# Patient Record
Sex: Female | Born: 1950 | Race: Black or African American | Hispanic: No | Marital: Married | Smoking: Former smoker
Health system: Southern US, Community
[De-identification: ages and names within clinical notes are randomized; demographics above are authoritative.]

## PROBLEM LIST (undated history)

## (undated) DIAGNOSIS — I10 Essential (primary) hypertension: Secondary | ICD-10-CM

## (undated) DIAGNOSIS — I639 Cerebral infarction, unspecified: Secondary | ICD-10-CM

## (undated) DIAGNOSIS — K219 Gastro-esophageal reflux disease without esophagitis: Secondary | ICD-10-CM

## (undated) DIAGNOSIS — J45909 Unspecified asthma, uncomplicated: Secondary | ICD-10-CM

## (undated) DIAGNOSIS — T4145XA Adverse effect of unspecified anesthetic, initial encounter: Secondary | ICD-10-CM

## (undated) DIAGNOSIS — D649 Anemia, unspecified: Secondary | ICD-10-CM

## (undated) DIAGNOSIS — E119 Type 2 diabetes mellitus without complications: Secondary | ICD-10-CM

## (undated) DIAGNOSIS — M1990 Unspecified osteoarthritis, unspecified site: Secondary | ICD-10-CM

## (undated) DIAGNOSIS — F419 Anxiety disorder, unspecified: Secondary | ICD-10-CM

## (undated) DIAGNOSIS — T8859XA Other complications of anesthesia, initial encounter: Secondary | ICD-10-CM

## (undated) DIAGNOSIS — G4730 Sleep apnea, unspecified: Secondary | ICD-10-CM

## (undated) HISTORY — PX: CARPAL TUNNEL RELEASE: SHX101

## (undated) HISTORY — PX: BACK SURGERY: SHX140

## (undated) HISTORY — PX: ABDOMINAL HYSTERECTOMY: SHX81

## (undated) HISTORY — PX: CERVICAL FUSION: SHX112

---

## 2004-06-11 ENCOUNTER — Inpatient Hospital Stay (HOSPITAL_COMMUNITY): Admission: RE | Admit: 2004-06-11 | Discharge: 2004-06-13 | Payer: Self-pay | Admitting: Neurosurgery

## 2005-06-11 IMAGING — RF DG CERVICAL SPINE 2 OR 3 VIEWS
1 series · 4 of 4 positions shown · non-contrast
Comparison: none

CLINICAL DATA: C3 through C7 anterior fusion for cervical disc herniation. 
TWO VIEW CERVICAL SPINE ? 06/11/04
AP and lateral C-arm views of the cervical spine demonstrate interbody bone plug and anterior screw and plate fusion at the C3 through C7 levels.  The bony detail is poorly visualized on the lateral view, making it impossible to confirm satisfactory screw position and impossible to assess alignment. 
IMPRESSION 
Suboptimal visualization of the cervical spine with hardware fusion as described above. Routine views are recommended when possible.

[Series 1: run · 4 of 4 slices shown]
[im 1/4]
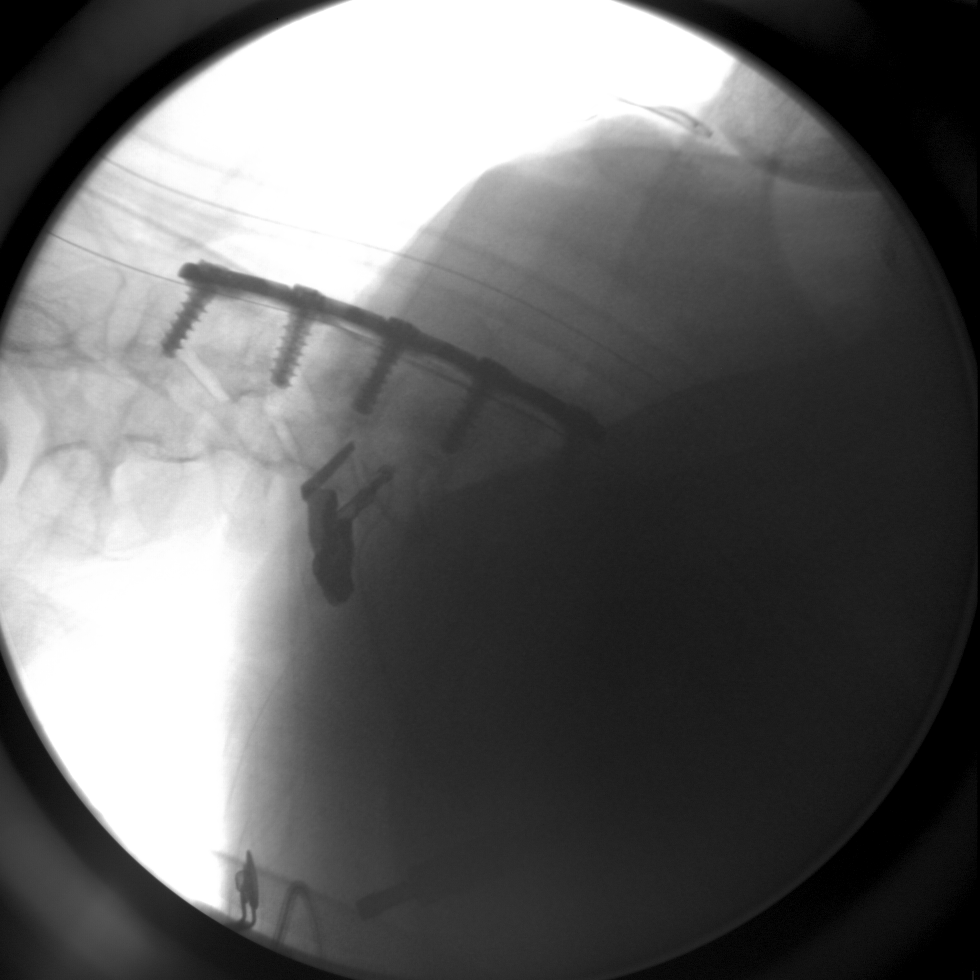
[im 2/4]
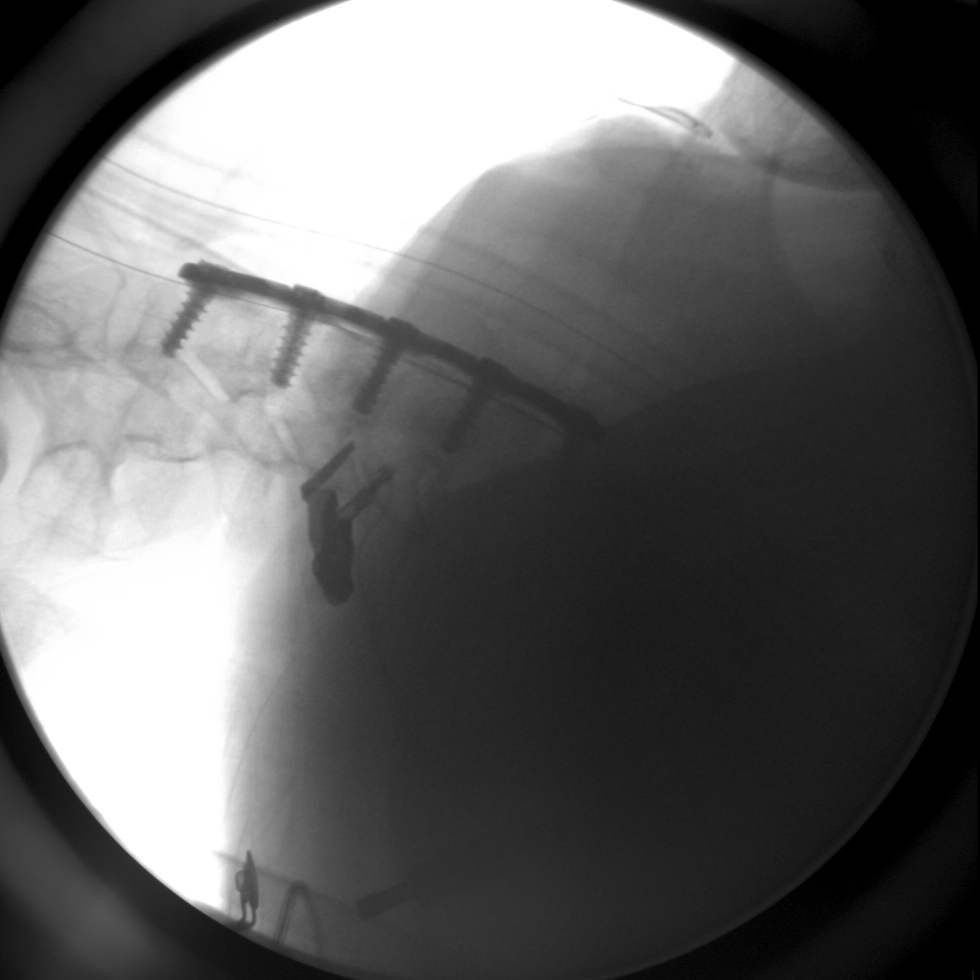
[im 3/4]
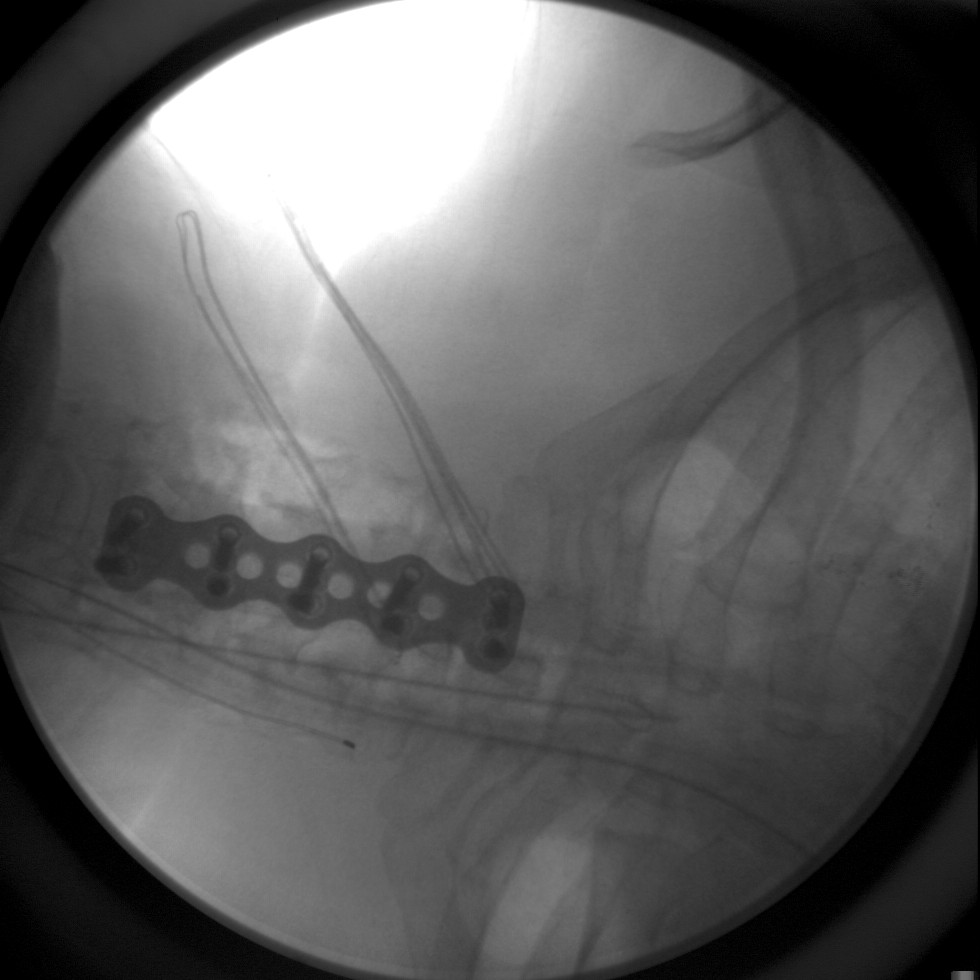
[im 4/4]
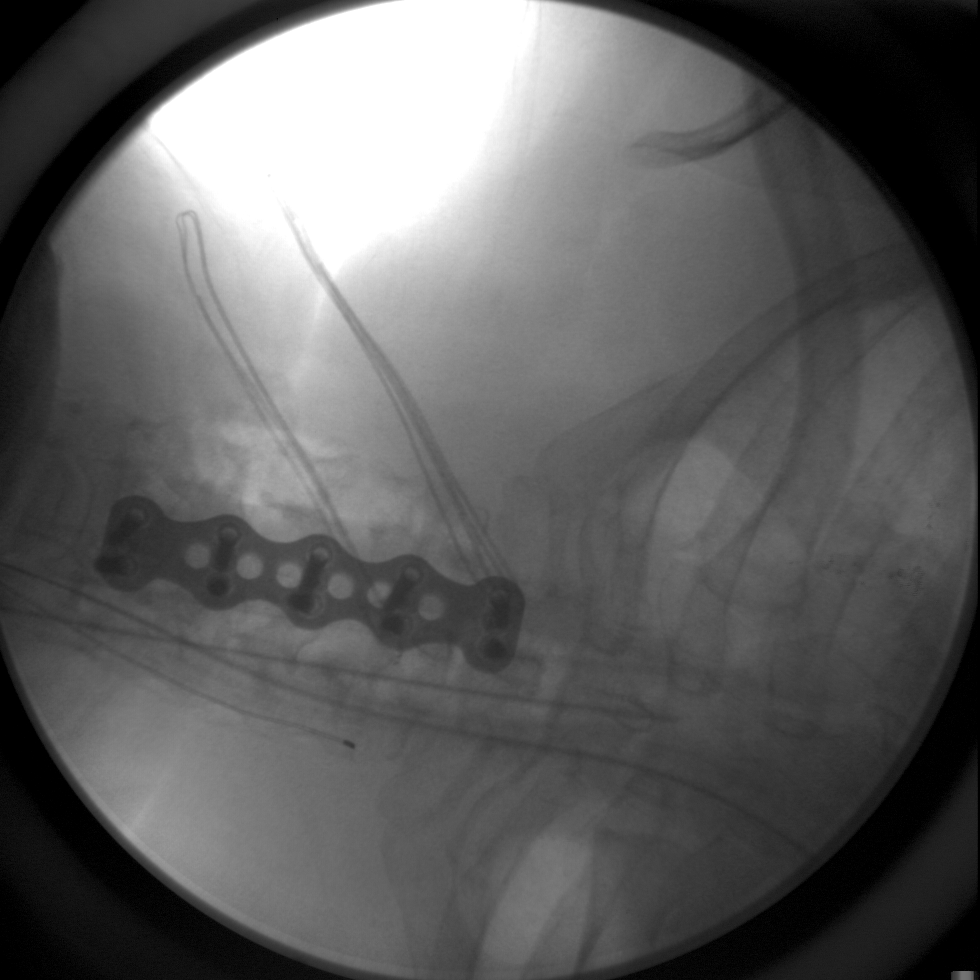

[4 of 4 positions shown; findings below may reference images not displayed]

## 2013-12-14 ENCOUNTER — Ambulatory Visit: Payer: Self-pay | Admitting: Neurosurgery

## 2015-03-23 ENCOUNTER — Ambulatory Visit
Admit: 2015-03-23 | Disposition: A | Discharge status: Home / Self Care | Payer: Self-pay | Attending: Neurosurgery | Admitting: Neurosurgery

## 2015-03-30 ENCOUNTER — Other Ambulatory Visit: Payer: Self-pay | Admitting: Neurosurgery

## 2015-04-04 ENCOUNTER — Encounter (HOSPITAL_COMMUNITY)
Admission: RE | Admit: 2015-04-04 | Discharge: 2015-04-04 | Disposition: A | Discharge status: Home / Self Care | Payer: Medicare Other | Source: Ambulatory Visit | Attending: Neurosurgery | Admitting: Neurosurgery

## 2015-04-04 ENCOUNTER — Encounter (HOSPITAL_COMMUNITY): Payer: Self-pay

## 2015-04-04 DIAGNOSIS — Z87891 Personal history of nicotine dependence: Secondary | ICD-10-CM | POA: Diagnosis not present

## 2015-04-04 DIAGNOSIS — Z981 Arthrodesis status: Secondary | ICD-10-CM | POA: Diagnosis not present

## 2015-04-04 DIAGNOSIS — M79604 Pain in right leg: Secondary | ICD-10-CM | POA: Diagnosis present

## 2015-04-04 DIAGNOSIS — Z8673 Personal history of transient ischemic attack (TIA), and cerebral infarction without residual deficits: Secondary | ICD-10-CM | POA: Diagnosis not present

## 2015-04-04 DIAGNOSIS — G4730 Sleep apnea, unspecified: Secondary | ICD-10-CM | POA: Diagnosis not present

## 2015-04-04 DIAGNOSIS — E119 Type 2 diabetes mellitus without complications: Secondary | ICD-10-CM | POA: Diagnosis not present

## 2015-04-04 DIAGNOSIS — Z90710 Acquired absence of both cervix and uterus: Secondary | ICD-10-CM | POA: Diagnosis not present

## 2015-04-04 DIAGNOSIS — K219 Gastro-esophageal reflux disease without esophagitis: Secondary | ICD-10-CM | POA: Diagnosis not present

## 2015-04-04 DIAGNOSIS — J45909 Unspecified asthma, uncomplicated: Secondary | ICD-10-CM | POA: Diagnosis not present

## 2015-04-04 DIAGNOSIS — I10 Essential (primary) hypertension: Secondary | ICD-10-CM | POA: Diagnosis not present

## 2015-04-04 DIAGNOSIS — M5117 Intervertebral disc disorders with radiculopathy, lumbosacral region: Secondary | ICD-10-CM | POA: Diagnosis not present

## 2015-04-04 DIAGNOSIS — F419 Anxiety disorder, unspecified: Secondary | ICD-10-CM | POA: Diagnosis not present

## 2015-04-04 DIAGNOSIS — Z7951 Long term (current) use of inhaled steroids: Secondary | ICD-10-CM | POA: Diagnosis not present

## 2015-04-04 HISTORY — DX: Essential (primary) hypertension: I10

## 2015-04-04 HISTORY — DX: Unspecified asthma, uncomplicated: J45.909

## 2015-04-04 HISTORY — DX: Anemia, unspecified: D64.9

## 2015-04-04 HISTORY — DX: Adverse effect of unspecified anesthetic, initial encounter: T41.45XA

## 2015-04-04 HISTORY — DX: Other complications of anesthesia, initial encounter: T88.59XA

## 2015-04-04 HISTORY — DX: Type 2 diabetes mellitus without complications: E11.9

## 2015-04-04 HISTORY — DX: Unspecified osteoarthritis, unspecified site: M19.90

## 2015-04-04 HISTORY — DX: Cerebral infarction, unspecified: I63.9

## 2015-04-04 HISTORY — DX: Sleep apnea, unspecified: G47.30

## 2015-04-04 HISTORY — DX: Anxiety disorder, unspecified: F41.9

## 2015-04-04 HISTORY — DX: Gastro-esophageal reflux disease without esophagitis: K21.9

## 2015-04-04 LAB — BASIC METABOLIC PANEL
ANION GAP: 11 (ref 5–15)
BUN: 12 mg/dL (ref 6–23)
CALCIUM: 9.9 mg/dL (ref 8.4–10.5)
CO2: 28 mmol/L (ref 19–32)
CREATININE: 0.88 mg/dL (ref 0.50–1.10)
Chloride: 100 mmol/L (ref 96–112)
GFR calc Af Amer: 78 mL/min — ABNORMAL LOW (ref >90)
GFR, EST NON AFRICAN AMERICAN: 67 mL/min — AB (ref >90)
Glucose, Bld: 71 mg/dL (ref 70–99)
Potassium: 3.6 mmol/L (ref 3.5–5.1)
Sodium: 139 mmol/L (ref 135–145)

## 2015-04-04 LAB — CBC WITH DIFFERENTIAL/PLATELET
BASOS ABS: 0 10*3/uL (ref 0.0–0.1)
BASOS PCT: 1 % (ref 0–1)
Eosinophils Absolute: 0.2 10*3/uL (ref 0.0–0.7)
Eosinophils Relative: 4 % (ref 0–5)
HCT: 34.8 % — ABNORMAL LOW (ref 36.0–46.0)
HEMOGLOBIN: 11.2 g/dL — AB (ref 12.0–15.0)
LYMPHS PCT: 43 % (ref 12–46)
Lymphs Abs: 2 10*3/uL (ref 0.7–4.0)
MCH: 27.9 pg (ref 26.0–34.0)
MCHC: 32.2 g/dL (ref 30.0–36.0)
MCV: 86.6 fL (ref 78.0–100.0)
MONOS PCT: 9 % (ref 3–12)
Monocytes Absolute: 0.4 10*3/uL (ref 0.1–1.0)
NEUTROS ABS: 2 10*3/uL (ref 1.7–7.7)
NEUTROS PCT: 43 % (ref 43–77)
Platelets: 242 10*3/uL (ref 150–400)
RBC: 4.02 MIL/uL (ref 3.87–5.11)
RDW: 14.1 % (ref 11.5–15.5)
WBC: 4.7 10*3/uL (ref 4.0–10.5)

## 2015-04-04 LAB — SURGICAL PCR SCREEN
MRSA, PCR: NEGATIVE
Staphylococcus aureus: NEGATIVE

## 2015-04-04 MED ORDER — DEXAMETHASONE SODIUM PHOSPHATE 10 MG/ML IJ SOLN
10.0000 mg | INTRAMUSCULAR | Status: AC
  Administered 2015-04-05: 10 mg via INTRAVENOUS
  Filled 2015-04-04: qty 1

## 2015-04-04 MED ORDER — CEFAZOLIN SODIUM-DEXTROSE 2-3 GM-% IV SOLR
2.0000 g | INTRAVENOUS | Status: AC
  Administered 2015-04-05: 2 g via INTRAVENOUS

## 2015-04-04 NOTE — Pre-Procedure Instructions (Signed)
Jessica Perkins  04/04/2015   Your procedure is scheduled on:  04-05-2015   Thursday   Report [facility] at 6:00 AM.   Call this number if you have problems the morning of surgery: [phone]   Remember:   Do not eat food or drink liquids after midnight.    Take these medicines the morning of surgery with A SIP OF WATER: inhaler if needed,astelin nasal spray,cyclobenzaprine(Flexeril)doxazocen(Cardura),fexofendine(allegra),Metoprolol(Lopressor),omeprazole(Prilosec)      Do not wear jewelry, make-up or nail polish.  Do not wear lotions, powders, or perfumes.   Do not shave 48 hours prior to surgery.   Do not bring valuables to the hospital.  [facility] is not responsible  for any belongings or valuables.               Contacts, dentures or bridgework may not be worn into surgery.   Leave suitcase in the car. After surgery it may be brought to your room.   For patients admitted to the hospital, discharge time is determined by your    treatment team.               Patients discharged the day of surgery will not be allowed to drive home    Special Instructions: See attached sheet for instructions on CHG shower/bath     Please read over the following fact sheets that you were given: Pain Booklet and Surgical Site Infection Prevention

## 2015-04-04 NOTE — Progress Notes (Signed)
Anesthesia Chart Review:  Patient is a 64 year old female scheduled for right L5-S1 microdiskectomy tomorrow by Dr. Jordan LikesPool.  History includes former smoker since 12/15/14, HTN, DM2, OSA with CPAP use, CVA, asthma, anemia, GERD, arthritis, cervical fusion, hysterectomy, back surgery. PCP is listed as Dr. Lanora ManisElizabeth Aderoju ([facility], see Care Everywhere).   12/21/11 Nuclear stress test (Care Everywhere; done during admission for atypical chest pain): IMPRESSION: 1. Estimated left ventricular ejection fraction within normal limits, 67 %. 2. Findings compatible with artifact or sequela from prior infarct. No evidence of reversible ischemia.  07/27/06 Echo (Care Everywhere): 1. This is a good quality study. 2. The size and ejection fraction of the left ventricle are normal. 3. The left atrium and right heart are normal. 4. The mitral valve appears normal. There is very slight mitral regurgitation. 5. The aortic valve is normal. 6. There is no pericardial effusion.  04/04/15 EKG: NSR, minimal voltage criteria for LVH, may be normal variant, septal infarct (age undetermined), non-specific ST flattening (rather diffuse).  There are EKG interpretations in Care Everywhere from 12/2011 that mentions SR with non-specific inferolateral T wave abnormalities.  Inferolateral T wave abnormality has improved when compared to 06/10/04 EKG in [facility].    Preoperative labs noted.   Further evaluation by her assigned anesthesiologist on the day of surgery.  If she remains asymptomatic from a CV standpoint then I would anticipate that she could proceed as planned.  Velna Ochsllison Othello Dickenson, PA-C [facility]Anesthesiology Phone [phone] 04/04/2015 3:28 PM

## 2015-04-05 ENCOUNTER — Inpatient Hospital Stay (HOSPITAL_COMMUNITY): Payer: Medicare Other

## 2015-04-05 ENCOUNTER — Inpatient Hospital Stay (HOSPITAL_COMMUNITY): Payer: Medicare Other | Admitting: Anesthesiology

## 2015-04-05 ENCOUNTER — Encounter (HOSPITAL_COMMUNITY): Payer: Self-pay | Admitting: Critical Care Medicine

## 2015-04-05 ENCOUNTER — Observation Stay (HOSPITAL_COMMUNITY)
Admission: RE | Admit: 2015-04-05 | Discharge: 2015-04-06 | Disposition: A | Discharge status: Home / Self Care | Payer: Medicare Other | Source: Ambulatory Visit | Attending: Neurosurgery | Admitting: Neurosurgery

## 2015-04-05 ENCOUNTER — Inpatient Hospital Stay (HOSPITAL_COMMUNITY): Payer: Medicare Other | Admitting: Vascular Surgery

## 2015-04-05 ENCOUNTER — Encounter (HOSPITAL_COMMUNITY)
Admission: RE | Disposition: A | Discharge status: Home / Self Care | Payer: Self-pay | Source: Ambulatory Visit | Attending: Neurosurgery

## 2015-04-05 DIAGNOSIS — I10 Essential (primary) hypertension: Secondary | ICD-10-CM | POA: Insufficient documentation

## 2015-04-05 DIAGNOSIS — M5116 Intervertebral disc disorders with radiculopathy, lumbar region: Secondary | ICD-10-CM | POA: Diagnosis present

## 2015-04-05 DIAGNOSIS — Z7951 Long term (current) use of inhaled steroids: Secondary | ICD-10-CM | POA: Insufficient documentation

## 2015-04-05 DIAGNOSIS — G4730 Sleep apnea, unspecified: Secondary | ICD-10-CM | POA: Diagnosis not present

## 2015-04-05 DIAGNOSIS — Z8673 Personal history of transient ischemic attack (TIA), and cerebral infarction without residual deficits: Secondary | ICD-10-CM | POA: Insufficient documentation

## 2015-04-05 DIAGNOSIS — M5117 Intervertebral disc disorders with radiculopathy, lumbosacral region: Secondary | ICD-10-CM | POA: Diagnosis not present

## 2015-04-05 DIAGNOSIS — J45909 Unspecified asthma, uncomplicated: Secondary | ICD-10-CM | POA: Insufficient documentation

## 2015-04-05 DIAGNOSIS — Z90710 Acquired absence of both cervix and uterus: Secondary | ICD-10-CM | POA: Insufficient documentation

## 2015-04-05 DIAGNOSIS — K219 Gastro-esophageal reflux disease without esophagitis: Secondary | ICD-10-CM | POA: Insufficient documentation

## 2015-04-05 DIAGNOSIS — M5126 Other intervertebral disc displacement, lumbar region: Secondary | ICD-10-CM | POA: Diagnosis present

## 2015-04-05 DIAGNOSIS — Z981 Arthrodesis status: Secondary | ICD-10-CM | POA: Insufficient documentation

## 2015-04-05 DIAGNOSIS — F419 Anxiety disorder, unspecified: Secondary | ICD-10-CM | POA: Insufficient documentation

## 2015-04-05 DIAGNOSIS — E119 Type 2 diabetes mellitus without complications: Secondary | ICD-10-CM | POA: Insufficient documentation

## 2015-04-05 DIAGNOSIS — Z87891 Personal history of nicotine dependence: Secondary | ICD-10-CM | POA: Insufficient documentation

## 2015-04-05 HISTORY — PX: LUMBAR LAMINECTOMY/DECOMPRESSION MICRODISCECTOMY: SHX5026

## 2015-04-05 LAB — GLUCOSE, CAPILLARY
GLUCOSE-CAPILLARY: 138 mg/dL — AB (ref 70–99)
GLUCOSE-CAPILLARY: 166 mg/dL — AB (ref 70–99)
Glucose-Capillary: 91 mg/dL (ref 70–99)
Glucose-Capillary: 91 mg/dL (ref 70–99)
Glucose-Capillary: 145 mg/dL — ABNORMAL HIGH (ref 70–99)

## 2015-04-05 SURGERY — LUMBAR LAMINECTOMY/DECOMPRESSION MICRODISCECTOMY 1 LEVEL
Anesthesia: General | Site: Back | Laterality: Right

## 2015-04-05 MED ORDER — LIDOCAINE HCL (CARDIAC) 20 MG/ML IV SOLN
INTRAVENOUS | Status: AC
  Filled 2015-04-05: qty 5

## 2015-04-05 MED ORDER — SODIUM CHLORIDE 0.9 % IJ SOLN
INTRAMUSCULAR | Status: AC
  Filled 2015-04-05: qty 10

## 2015-04-05 MED ORDER — OXYCODONE HCL 5 MG/5ML PO SOLN: 5.0000 mg | Freq: Once | ORAL | Status: DC | PRN

## 2015-04-05 MED ORDER — AZELASTINE HCL 0.1 % NA SOLN
2.0000 | Freq: Two times a day (BID) | NASAL | Status: DC
  Administered 2015-04-05: 2 via NASAL
  Filled 2015-04-05: qty 30

## 2015-04-05 MED ORDER — HYDROCHLOROTHIAZIDE 25 MG PO TABS
25.0000 mg | ORAL_TABLET | Freq: Every day | ORAL | Status: DC
  Administered 2015-04-05 – 2015-04-06 (×2): 25 mg via ORAL
  Filled 2015-04-05 (×2): qty 1

## 2015-04-05 MED ORDER — LIDOCAINE HCL (CARDIAC) 20 MG/ML IV SOLN
INTRAVENOUS | Status: DC | PRN
  Administered 2015-04-05: 50 mg via INTRAVENOUS

## 2015-04-05 MED ORDER — FENTANYL CITRATE (PF) 250 MCG/5ML IJ SOLN
INTRAMUSCULAR | Status: AC
  Filled 2015-04-05: qty 5

## 2015-04-05 MED ORDER — ACETAMINOPHEN 325 MG PO TABS: 650.0000 mg | ORAL_TABLET | ORAL | Status: DC | PRN

## 2015-04-05 MED ORDER — HYDROMORPHONE HCL 1 MG/ML IJ SOLN
INTRAMUSCULAR | Status: AC
  Administered 2015-04-05: 0.5 mg via INTRAVENOUS
  Filled 2015-04-05: qty 1

## 2015-04-05 MED ORDER — KETOROLAC TROMETHAMINE 30 MG/ML IJ SOLN
30.0000 mg | Freq: Four times a day (QID) | INTRAMUSCULAR | Status: DC
  Administered 2015-04-05 (×2): 30 mg via INTRAVENOUS
  Filled 2015-04-05 (×6): qty 1

## 2015-04-05 MED ORDER — ONDANSETRON HCL 4 MG/2ML IJ SOLN: 4.0000 mg | INTRAMUSCULAR | Status: DC | PRN

## 2015-04-05 MED ORDER — BUPIVACAINE HCL (PF) 0.25 % IJ SOLN
INTRAMUSCULAR | Status: DC | PRN
  Administered 2015-04-05: 20 mL

## 2015-04-05 MED ORDER — PRAVASTATIN SODIUM 20 MG PO TABS
20.0000 mg | ORAL_TABLET | Freq: Every day | ORAL | Status: DC
  Administered 2015-04-05: 20 mg via ORAL
  Filled 2015-04-05 (×2): qty 1

## 2015-04-05 MED ORDER — LORATADINE 10 MG PO TABS
10.0000 mg | ORAL_TABLET | Freq: Every day | ORAL | Status: DC
  Administered 2015-04-05 – 2015-04-06 (×2): 10 mg via ORAL
  Filled 2015-04-05 (×2): qty 1

## 2015-04-05 MED ORDER — FENTANYL CITRATE (PF) 100 MCG/2ML IJ SOLN
INTRAMUSCULAR | Status: DC | PRN
  Administered 2015-04-05 (×3): 50 ug via INTRAVENOUS

## 2015-04-05 MED ORDER — ROCURONIUM BROMIDE 100 MG/10ML IV SOLN
INTRAVENOUS | Status: DC | PRN
  Administered 2015-04-05: 30 mg via INTRAVENOUS
  Administered 2015-04-05: 10 mg via INTRAVENOUS

## 2015-04-05 MED ORDER — PROPOFOL 10 MG/ML IV BOLUS
INTRAVENOUS | Status: DC | PRN
  Administered 2015-04-05: 140 mg via INTRAVENOUS

## 2015-04-05 MED ORDER — NEOSTIGMINE METHYLSULFATE 10 MG/10ML IV SOLN
INTRAVENOUS | Status: DC | PRN
  Administered 2015-04-05: 3 mg via INTRAVENOUS

## 2015-04-05 MED ORDER — CEFAZOLIN SODIUM 1-5 GM-% IV SOLN
1.0000 g | Freq: Three times a day (TID) | INTRAVENOUS | Status: AC
  Administered 2015-04-05 (×2): 1 g via INTRAVENOUS
  Filled 2015-04-05 (×2): qty 50

## 2015-04-05 MED ORDER — HYDROCODONE-ACETAMINOPHEN 5-325 MG PO TABS
1.0000 | ORAL_TABLET | ORAL | Status: DC | PRN
  Administered 2015-04-05 – 2015-04-06 (×4): 2 via ORAL
  Filled 2015-04-05 (×4): qty 2

## 2015-04-05 MED ORDER — PROPOFOL 10 MG/ML IV BOLUS
INTRAVENOUS | Status: AC
  Filled 2015-04-05: qty 20

## 2015-04-05 MED ORDER — OXYCODONE HCL 5 MG PO TABS: 5.0000 mg | ORAL_TABLET | Freq: Once | ORAL | Status: DC | PRN

## 2015-04-05 MED ORDER — CYCLOBENZAPRINE HCL 10 MG PO TABS
10.0000 mg | ORAL_TABLET | Freq: Three times a day (TID) | ORAL | Status: DC | PRN
  Administered 2015-04-05 (×2): 10 mg via ORAL
  Filled 2015-04-05 (×3): qty 1

## 2015-04-05 MED ORDER — ROCURONIUM BROMIDE 50 MG/5ML IV SOLN
INTRAVENOUS | Status: AC
  Filled 2015-04-05: qty 1

## 2015-04-05 MED ORDER — MOMETASONE FURO-FORMOTEROL FUM 100-5 MCG/ACT IN AERO
2.0000 | INHALATION_SPRAY | Freq: Two times a day (BID) | RESPIRATORY_TRACT | Status: DC
  Administered 2015-04-06: 2 via RESPIRATORY_TRACT
  Filled 2015-04-05: qty 8.8

## 2015-04-05 MED ORDER — PANTOPRAZOLE SODIUM 40 MG PO TBEC
40.0000 mg | DELAYED_RELEASE_TABLET | Freq: Every day | ORAL | Status: DC
  Administered 2015-04-06: 40 mg via ORAL
  Filled 2015-04-05: qty 1

## 2015-04-05 MED ORDER — SODIUM CHLORIDE 0.9 % IR SOLN
Status: DC | PRN
  Administered 2015-04-05: 08:00:00

## 2015-04-05 MED ORDER — THROMBIN 5000 UNITS EX SOLR
CUTANEOUS | Status: DC | PRN
Start: 2015-04-05 — End: 2015-04-05
  Administered 2015-04-05 (×2): 5000 [IU] via TOPICAL

## 2015-04-05 MED ORDER — MIDAZOLAM HCL 5 MG/5ML IJ SOLN
INTRAMUSCULAR | Status: DC | PRN
  Administered 2015-04-05: 2 mg via INTRAVENOUS

## 2015-04-05 MED ORDER — HYDROMORPHONE HCL 1 MG/ML IJ SOLN
0.2500 mg | INTRAMUSCULAR | Status: DC | PRN
  Administered 2015-04-05 (×4): 0.5 mg via INTRAVENOUS

## 2015-04-05 MED ORDER — IRBESARTAN 300 MG PO TABS
300.0000 mg | ORAL_TABLET | Freq: Every day | ORAL | Status: DC
  Administered 2015-04-05 – 2015-04-06 (×2): 300 mg via ORAL
  Filled 2015-04-05 (×2): qty 1

## 2015-04-05 MED ORDER — KETOROLAC TROMETHAMINE 30 MG/ML IJ SOLN: 30.0000 mg | Freq: Once | INTRAMUSCULAR | Status: DC | PRN

## 2015-04-05 MED ORDER — OXYCODONE-ACETAMINOPHEN 5-325 MG PO TABS
ORAL_TABLET | ORAL | Status: AC
  Administered 2015-04-05: 2 via ORAL
  Filled 2015-04-05: qty 2

## 2015-04-05 MED ORDER — PROMETHAZINE HCL 25 MG/ML IJ SOLN: 6.2500 mg | INTRAMUSCULAR | Status: DC | PRN

## 2015-04-05 MED ORDER — METOPROLOL TARTRATE 100 MG PO TABS
100.0000 mg | ORAL_TABLET | Freq: Two times a day (BID) | ORAL | Status: DC
  Administered 2015-04-05 – 2015-04-06 (×2): 100 mg via ORAL
  Filled 2015-04-05 (×3): qty 1

## 2015-04-05 MED ORDER — ALBUTEROL SULFATE (2.5 MG/3ML) 0.083% IN NEBU: 2.5000 mg | INHALATION_SOLUTION | Freq: Four times a day (QID) | RESPIRATORY_TRACT | Status: DC | PRN

## 2015-04-05 MED ORDER — LACTATED RINGERS IV SOLN
INTRAVENOUS | Status: DC | PRN
  Administered 2015-04-05 (×2): via INTRAVENOUS

## 2015-04-05 MED ORDER — KETOROLAC TROMETHAMINE 30 MG/ML IJ SOLN
INTRAMUSCULAR | Status: DC | PRN
  Administered 2015-04-05: 30 mg via INTRAVENOUS

## 2015-04-05 MED ORDER — SUCCINYLCHOLINE CHLORIDE 20 MG/ML IJ SOLN
INTRAMUSCULAR | Status: AC
  Filled 2015-04-05: qty 1

## 2015-04-05 MED ORDER — MIDAZOLAM HCL 2 MG/2ML IJ SOLN
INTRAMUSCULAR | Status: AC
  Filled 2015-04-05: qty 2

## 2015-04-05 MED ORDER — MEPERIDINE HCL 25 MG/ML IJ SOLN: 6.2500 mg | INTRAMUSCULAR | Status: DC | PRN

## 2015-04-05 MED ORDER — GLYCOPYRROLATE 0.2 MG/ML IJ SOLN
INTRAMUSCULAR | Status: DC | PRN
  Administered 2015-04-05: 0.4 mg via INTRAVENOUS

## 2015-04-05 MED ORDER — EPHEDRINE SULFATE 50 MG/ML IJ SOLN
INTRAMUSCULAR | Status: AC
  Filled 2015-04-05: qty 1

## 2015-04-05 MED ORDER — ARTIFICIAL TEARS OP OINT
TOPICAL_OINTMENT | OPHTHALMIC | Status: AC
  Filled 2015-04-05: qty 3.5

## 2015-04-05 MED ORDER — SODIUM CHLORIDE 0.9 % IJ SOLN: 3.0000 mL | INTRAMUSCULAR | Status: DC | PRN

## 2015-04-05 MED ORDER — ONDANSETRON HCL 4 MG/2ML IJ SOLN
INTRAMUSCULAR | Status: DC | PRN
  Administered 2015-04-05: 4 mg via INTRAVENOUS

## 2015-04-05 MED ORDER — CYCLOBENZAPRINE HCL 10 MG PO TABS
ORAL_TABLET | ORAL | Status: AC
  Administered 2015-04-05: 10 mg via ORAL
  Filled 2015-04-05: qty 1

## 2015-04-05 MED ORDER — 0.9 % SODIUM CHLORIDE (POUR BTL) OPTIME
TOPICAL | Status: DC | PRN
  Administered 2015-04-05: 1000 mL

## 2015-04-05 MED ORDER — HEMOSTATIC AGENTS (NO CHARGE) OPTIME
TOPICAL | Status: DC | PRN
  Administered 2015-04-05: 1 via TOPICAL

## 2015-04-05 MED ORDER — DOXAZOSIN MESYLATE 1 MG PO TABS
1.0000 mg | ORAL_TABLET | Freq: Every day | ORAL | Status: DC
  Administered 2015-04-06: 1 mg via ORAL
  Filled 2015-04-05: qty 1

## 2015-04-05 MED ORDER — TELMISARTAN-HCTZ 80-25 MG PO TABS: 1.0000 | ORAL_TABLET | Freq: Every day | ORAL | Status: DC

## 2015-04-05 MED ORDER — ARTIFICIAL TEARS OP OINT
TOPICAL_OINTMENT | OPHTHALMIC | Status: DC | PRN
Start: 2015-04-05 — End: 2015-04-05
  Administered 2015-04-05: 1 via OPHTHALMIC

## 2015-04-05 MED ORDER — HYDROMORPHONE HCL 1 MG/ML IJ SOLN: 0.5000 mg | INTRAMUSCULAR | Status: DC | PRN

## 2015-04-05 MED ORDER — METFORMIN HCL 500 MG PO TABS
1000.0000 mg | ORAL_TABLET | Freq: Two times a day (BID) | ORAL | Status: DC
Start: 2015-04-05 — End: 2015-04-06
  Administered 2015-04-05 – 2015-04-06 (×2): 1000 mg via ORAL
  Filled 2015-04-05 (×4): qty 2

## 2015-04-05 MED ORDER — MENTHOL 3 MG MT LOZG: 1.0000 | LOZENGE | OROMUCOSAL | Status: DC | PRN

## 2015-04-05 MED ORDER — ACETAMINOPHEN 650 MG RE SUPP: 650.0000 mg | RECTAL | Status: DC | PRN

## 2015-04-05 MED ORDER — LIDOCAINE HCL (CARDIAC) 20 MG/ML IV SOLN
INTRAVENOUS | Status: AC
  Filled 2015-04-05: qty 10

## 2015-04-05 MED ORDER — SODIUM CHLORIDE 0.9 % IJ SOLN
3.0000 mL | Freq: Two times a day (BID) | INTRAMUSCULAR | Status: DC
  Administered 2015-04-05 (×2): 3 mL via INTRAVENOUS

## 2015-04-05 MED ORDER — PHENOL 1.4 % MT LIQD: 1.0000 | OROMUCOSAL | Status: DC | PRN

## 2015-04-05 MED ORDER — OXYCODONE-ACETAMINOPHEN 5-325 MG PO TABS
1.0000 | ORAL_TABLET | ORAL | Status: DC | PRN
  Administered 2015-04-05: 2 via ORAL

## 2015-04-05 SURGICAL SUPPLY — 49 items
BAG DECANTER FOR FLEXI CONT (MISCELLANEOUS) ×3 IMPLANT
BENZOIN TINCTURE PRP APPL 2/3 (GAUZE/BANDAGES/DRESSINGS) ×3 IMPLANT
BLADE CLIPPER SURG (BLADE) IMPLANT
BRUSH SCRUB EZ PLAIN DRY (MISCELLANEOUS) ×3 IMPLANT
BUR CUTTER 7.0 ROUND (BURR) ×3 IMPLANT
CANISTER SUCT 3000ML PPV (MISCELLANEOUS) ×3 IMPLANT
CLOSURE WOUND 1/2 X4 (GAUZE/BANDAGES/DRESSINGS) ×1
CONT SPEC 4OZ CLIKSEAL STRL BL (MISCELLANEOUS) ×3 IMPLANT
DECANTER SPIKE VIAL GLASS SM (MISCELLANEOUS) IMPLANT
DERMABOND ADVANCED (GAUZE/BANDAGES/DRESSINGS) ×2
DERMABOND ADVANCED .7 DNX12 (GAUZE/BANDAGES/DRESSINGS) ×1 IMPLANT
DRAPE LAPAROTOMY 100X72X124 (DRAPES) ×3 IMPLANT
DRAPE MICROSCOPE LEICA (MISCELLANEOUS) ×3 IMPLANT
DRAPE POUCH INSTRU U-SHP 10X18 (DRAPES) ×3 IMPLANT
DRAPE PROXIMA HALF (DRAPES) IMPLANT
DRAPE SURG 17X23 STRL (DRAPES) ×6 IMPLANT
DRSG OPSITE POSTOP 3X4 (GAUZE/BANDAGES/DRESSINGS) ×3 IMPLANT
DURAPREP 26ML APPLICATOR (WOUND CARE) ×3 IMPLANT
ELECT REM PT RETURN 9FT ADLT (ELECTROSURGICAL) ×3
ELECTRODE REM PT RTRN 9FT ADLT (ELECTROSURGICAL) ×1 IMPLANT
GAUZE SPONGE 4X4 12PLY STRL (GAUZE/BANDAGES/DRESSINGS) ×3 IMPLANT
GAUZE SPONGE 4X4 16PLY XRAY LF (GAUZE/BANDAGES/DRESSINGS) IMPLANT
GLOVE ECLIPSE 9.0 STRL (GLOVE) ×3 IMPLANT
GLOVE EXAM NITRILE LRG STRL (GLOVE) IMPLANT
GLOVE EXAM NITRILE MD LF STRL (GLOVE) IMPLANT
GLOVE EXAM NITRILE XL STR (GLOVE) IMPLANT
GLOVE EXAM NITRILE XS STR PU (GLOVE) IMPLANT
GOWN STRL REUS W/ TWL LRG LVL3 (GOWN DISPOSABLE) IMPLANT
GOWN STRL REUS W/ TWL XL LVL3 (GOWN DISPOSABLE) ×1 IMPLANT
GOWN STRL REUS W/TWL 2XL LVL3 (GOWN DISPOSABLE) IMPLANT
GOWN STRL REUS W/TWL LRG LVL3 (GOWN DISPOSABLE)
GOWN STRL REUS W/TWL XL LVL3 (GOWN DISPOSABLE) ×2
KIT BASIN OR (CUSTOM PROCEDURE TRAY) ×3 IMPLANT
KIT ROOM TURNOVER OR (KITS) ×3 IMPLANT
LIQUID BAND (GAUZE/BANDAGES/DRESSINGS) ×3 IMPLANT
NEEDLE HYPO 22GX1.5 SAFETY (NEEDLE) ×3 IMPLANT
NEEDLE SPNL 22GX3.5 QUINCKE BK (NEEDLE) ×3 IMPLANT
NS IRRIG 1000ML POUR BTL (IV SOLUTION) ×3 IMPLANT
PACK LAMINECTOMY NEURO (CUSTOM PROCEDURE TRAY) ×3 IMPLANT
PAD ARMBOARD 7.5X6 YLW CONV (MISCELLANEOUS) ×9 IMPLANT
RUBBERBAND STERILE (MISCELLANEOUS) ×6 IMPLANT
SPONGE SURGIFOAM ABS GEL SZ50 (HEMOSTASIS) ×3 IMPLANT
STRIP CLOSURE SKIN 1/2X4 (GAUZE/BANDAGES/DRESSINGS) ×2 IMPLANT
SUT VIC AB 2-0 CT1 18 (SUTURE) ×3 IMPLANT
SUT VIC AB 3-0 SH 8-18 (SUTURE) ×3 IMPLANT
SYR 20ML ECCENTRIC (SYRINGE) ×3 IMPLANT
TOWEL OR 17X24 6PK STRL BLUE (TOWEL DISPOSABLE) ×3 IMPLANT
TOWEL OR 17X26 10 PK STRL BLUE (TOWEL DISPOSABLE) ×3 IMPLANT
WATER STERILE IRR 1000ML POUR (IV SOLUTION) ×3 IMPLANT

## 2015-04-05 NOTE — Brief Op Note (Signed)
04/05/2015  9:20 AM  PATIENT:  Jessica Perkins  64 y.o. female  PRE-OPERATIVE DIAGNOSIS:  HNP  POST-OPERATIVE DIAGNOSIS:  HNP  PROCEDURE:  Procedure(s): LUMBAR LAMINECTOMY/DECOMPRESSION MICRODISCECTOMY 1 LEVEL (Right)  SURGEON:  Surgeon(s) and Role:    * Julio SicksHenry Fayelynn Distel, MD - Primary    * Tia Alertavid S Jones, MD - Assisting  PHYSICIAN ASSISTANT:   ASSISTANTS:    ANESTHESIA:   general  EBL:  Total I/O In: 1000 [I.V.:1000] Out: 20 [Blood:20]  BLOOD ADMINISTERED:none  DRAINS: none   LOCAL MEDICATIONS USED:  MARCAINE     SPECIMEN:  No Specimen  DISPOSITION OF SPECIMEN:  N/A  COUNTS:  YES  TOURNIQUET:  * No tourniquets in log *  DICTATION: .Dragon Dictation  PLAN OF CARE: Admit to inpatient   PATIENT DISPOSITION:  PACU - hemodynamically stable.   Delay start of Pharmacological VTE agent (>24hrs) due to surgical blood loss or risk of bleeding: yes

## 2015-04-05 NOTE — Progress Notes (Signed)
Placed patient on CPAP for the night via auto-mode with minimum pressure set at 6cm and maximum pressure set at 20cm  

## 2015-04-05 NOTE — H&P (Signed)
Jessica Perkins is an 64 y.o. female.   Chief Complaint: Back and right leg pain HPI: 63 year old female status post right L5-S1 laminotomy and discectomy done greater than 30 years ago presents with recurrent back and right lower extremity pain. Workup demonstrates evidence of a significant right L5-S1 recurrent disc herniation with compression of the right S1 nerve root. Patient has failed conservative management and presents now for redo laminotomy and microdiscectomy.  Past Medical History  Diagnosis Date  . Complication of anesthesia     slow to wake up  . Stroke   . Hypertension   . Sleep apnea     uses CPAP  . Asthma   . Diabetes mellitus without complication   . Anxiety   . GERD (gastroesophageal reflux disease)   . Arthritis   . Anemia     Past Surgical History  Procedure Laterality Date  . Back surgery    . Abdominal hysterectomy    . Carpal tunnel release Right   . Cervical fusion      History reviewed. No pertinent family history. Social History:  reports that she quit smoking about 3 months ago. She does not have any smokeless tobacco history on file. She reports that she does not drink alcohol or use illicit drugs.  Allergies: No Known Allergies  Medications Prior to Admission  Medication Sig Dispense Refill  . albuterol (PROVENTIL HFA;VENTOLIN HFA) 108 (90 BASE) MCG/ACT inhaler Inhale 2 puffs into the lungs every 6 (six) hours as needed for wheezing or shortness of breath.    Marland Kitchen azelastine (ASTELIN) 0.1 % nasal spray Place 2 sprays into both nostrils 2 (two) times daily. Use in each nostril as directed    . cyclobenzaprine (FLEXERIL) 10 MG tablet Take 10 mg by mouth 2 (two) times daily.    Marland Kitchen desonide (DESOWEN) 0.05 % cream Apply 1 application topically 2 (two) times daily.    Marland Kitchen doxazosin (CARDURA) 1 MG tablet Take 1 mg by mouth daily.    . fexofenadine (ALLEGRA) 180 MG tablet Take 180 mg by mouth daily as needed for allergies or rhinitis.    .  Fluticasone-Salmeterol (ADVAIR) 250-50 MCG/DOSE AEPB Inhale into the lungs 2 (two) times daily.    Marland Kitchen ibuprofen (ADVIL,MOTRIN) 800 MG tablet Take 800 mg by mouth every 8 (eight) hours as needed for moderate pain.    . metFORMIN (GLUCOPHAGE) 1000 MG tablet Take 1,000 mg by mouth 2 (two) times daily with a meal.    . metoprolol (LOPRESSOR) 100 MG tablet Take 100 mg by mouth 2 (two) times daily.    Marland Kitchen omeprazole (PRILOSEC) 20 MG capsule Take 40 mg by mouth daily.    . pravastatin (PRAVACHOL) 20 MG tablet Take 20 mg by mouth daily.    Marland Kitchen telmisartan-hydrochlorothiazide (MICARDIS HCT) 80-25 MG per tablet Take 1 tablet by mouth daily.      Results for orders placed or performed during the hospital encounter of 04/05/15 (from the past 48 hour(s))  Glucose, capillary     Status: None   Collection Time: 04/05/15  6:44 AM  Result Value Ref Range   Glucose-Capillary 91 70 - 99 mg/dL   No results found.  Review of Systems  Constitutional: Negative.   HENT: Negative.   Eyes: Negative.   Respiratory: Negative.   Cardiovascular: Negative.   Gastrointestinal: Negative.   Genitourinary: Negative.   Musculoskeletal: Negative.   Skin: Negative.   Neurological: Negative.   Endo/Heme/Allergies: Negative.   Psychiatric/Behavioral: Negative.  Blood pressure 155/76, pulse 73, temperature 97.6 F (36.4 C), temperature source Oral, resp. rate 18, weight 71.215 kg (157 lb), SpO2 99 %. Physical Exam  Constitutional: She is oriented to person, place, and time. She appears well-developed and well-nourished.  HENT:  Head: Normocephalic and atraumatic.  Right Ear: External ear normal.  Left Ear: External ear normal.  Nose: Nose normal.  Mouth/Throat: Oropharynx is clear and moist.  Eyes: Conjunctivae and EOM are normal. Pupils are equal, round, and reactive to light. Right eye exhibits no discharge. Left eye exhibits no discharge.  Neck: Normal range of motion. Neck supple. No tracheal deviation present.  No thyromegaly present.  Cardiovascular: Normal rate, regular rhythm, normal heart sounds and intact distal pulses.  Exam reveals no friction rub.   No murmur heard. Respiratory: Effort normal and breath sounds normal. No respiratory distress. She has no wheezes.  GI: Soft. Bowel sounds are normal. She exhibits no distension. There is no tenderness.  Musculoskeletal: Normal range of motion. She exhibits no edema or tenderness.  Neurological: She is alert and oriented to person, place, and time. She has normal reflexes. Coordination normal.  Skin: Skin is warm and dry. She is not diaphoretic.  Psychiatric: She has a normal mood and affect. Her behavior is normal. Judgment and thought content normal.     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 Plan right L5-S1 redo laminotomy and microdiscectomy. Risks and benefits of been explained. Patient wishes to proceed.  Jessica Perkins A 04/05/2015, 7:36 AM

## 2015-04-05 NOTE — Transfer of Care (Signed)
Immediate Anesthesia Transfer of Care Note  Patient: Jessica Perkins  Procedure(s) Performed: Procedure(s): LUMBAR LAMINECTOMY/DECOMPRESSION MICRODISCECTOMY 1 LEVEL (Right)  Patient Location: PACU  Anesthesia Type:General  Level of Consciousness: awake and alert   Airway & Oxygen Therapy: Patient Spontanous Breathing and Patient connected to nasal cannula oxygen  Post-op Assessment: Report given to RN, Post -op Vital signs reviewed and stable and Patient moving all extremities X 4  Post vital signs: Reviewed and stable  Last Vitals:  Filed Vitals:   04/05/15 0641  BP: 155/76  Pulse: 73  Temp: 36.4 C  Resp: 18    Complications: No apparent anesthesia complications

## 2015-04-05 NOTE — Plan of Care (Signed)
Problem: Consults Goal: Diagnosis - Spinal Surgery Outcome: Completed/Met Date Met:  04/05/15 Microdiscectomy     

## 2015-04-05 NOTE — Anesthesia Preprocedure Evaluation (Addendum)
Anesthesia Evaluation  Patient identified by MRN, date of birth, ID band Patient awake    Reviewed: Allergy & Precautions, NPO status , Patient's Chart, lab work & pertinent test results, reviewed documented beta blocker date and time   Airway Mallampati: II  TM Distance: >3 FB Neck ROM: Full    Dental no notable dental hx.    Pulmonary asthma , sleep apnea , former smoker,  breath sounds clear to auscultation  Pulmonary exam normal       Cardiovascular hypertension, Pt. on medications and Pt. on home beta blockers Rhythm:Regular Rate:Normal     Neuro/Psych CVA negative neurological ROS  negative psych ROS   GI/Hepatic Neg liver ROS, GERD-  Medicated,  Endo/Other  diabetes, Type 2, Oral Hypoglycemic Agents  Renal/GU negative Renal ROS     Musculoskeletal  (+) Arthritis -,   Abdominal   Peds  Hematology negative hematology ROS (+) anemia ,   Anesthesia Other Findings   Reproductive/Obstetrics negative OB ROS                            Anesthesia Physical Anesthesia Plan  ASA: III  Anesthesia Plan: General   Post-op Pain Management:    Induction: Intravenous  Airway Management Planned:   Additional Equipment:   Intra-op Plan:   Post-operative Plan: Extubation in OR  Informed Consent: I have reviewed the patients History and Physical, chart, labs and discussed the procedure including the risks, benefits and alternatives for the proposed anesthesia with the patient or authorized representative who has indicated his/her understanding and acceptance.   Dental advisory given  Plan Discussed with: CRNA  Anesthesia Plan Comments:         Anesthesia Quick Evaluation

## 2015-04-05 NOTE — Anesthesia Procedure Notes (Signed)
Procedure Name: Intubation Date/Time: 04/05/2015 7:51 AM Performed by: Glo HerringLEE, Shanicqua Coldren B Pre-anesthesia Checklist: Patient identified, Emergency Drugs available, Suction available, Patient being monitored and Timeout performed Patient Re-evaluated:Patient Re-evaluated prior to inductionOxygen Delivery Method: Circle system utilized Preoxygenation: Pre-oxygenation with 100% oxygen Intubation Type: IV induction and Cricoid Pressure applied Ventilation: Mask ventilation without difficulty Laryngoscope Size: Mac and 3 Grade View: Grade III Tube type: Oral Tube size: 7.0 mm Number of attempts: 1 Airway Equipment and Method: Stylet Placement Confirmation: breath sounds checked- equal and bilateral,  CO2 detector,  positive ETCO2 and ETT inserted through vocal cords under direct vision Secured at: 21 cm Tube secured with: Tape Dental Injury: Teeth and Oropharynx as per pre-operative assessment

## 2015-04-05 NOTE — Anesthesia Postprocedure Evaluation (Signed)
Anesthesia Post Note  Patient: Jessica Perkins  Procedure(s) Performed: Procedure(s) (LRB): LUMBAR LAMINECTOMY/DECOMPRESSION MICRODISCECTOMY 1 LEVEL (Right)  Anesthesia type: General  Patient location: PACU  Post pain: Pain level controlled  Post assessment: Post-op Vital signs reviewed  Last Vitals: BP 167/90 mmHg  Pulse 68  Temp(Src) 36.4 C (Oral)  Resp 18  Wt 157 lb (71.215 kg)  SpO2 94%  Post vital signs: Reviewed  Level of consciousness: sedated  Complications: No apparent anesthesia complications

## 2015-04-05 NOTE — Op Note (Signed)
Date of procedure: 04/05/2015  Date of dictation: Same  Service: Neurosurgery  Preoperative diagnosis: Right L5-S1 recurrent herniated nucleus pulposus with radiculopathy  Postoperative diagnosis: Same  Procedure Name: Right L5-S1 reexploration of laminotomy with redo microdiscectomy  Surgeon:Devin Foskey A.Kellis Topete, M.D.  Asst. Surgeon: Yetta BarreJones  Anesthesia: General  Indication: 64 year old female with history of prior right L5-S1 laminotomy and discectomy done 20 years ago presents with recurrent severe right lower extremity recurrent pain consistent with a right-sided S1 radiculopathy. Workup demonstrates evidence of a right-sided L5-S1 recurrent disc herniation. Patient is failed conservative management. She presents today for redo microdiscectomy.  Operative note: After induction of anesthesia, patient position prone onto Wilson frame and appropriately padded. Lumbar region prepped and draped sterilely. Incision made overlying L5-S1. Dissection performed the right side. Retractor placed. X-ray taken. Level. Previous laminotomy dissected free. Epidural scar resected. Laminotomy then widened using a high-speed drill and Kerrison rongeurs. Thecal sac and S1 nerve root identified. Microscope brought field these were microdissection. Epidural venous plexus quite related and cut. Thecal sac and S1 nerve root were gently mobilized and retracted towards midline. A calcified disc herniation emanating from the L5-S1 disc space and migrating superiorly was dissected free and removed in large pieces. The disc space itself was entered. All loose are obvious degenerative disc tear was removed and interspace. This point a very thorough decompression had been achieved. There was no evidence of injury to thecal sac or nerve roots. Wound is then irrigated MI solution. Gelfoam was placed topically for hemostasis. Microscope and retractor system were removed. Hemostasis of the muscle was achieved with a large Carter. Wounds  and close in layers with Vicryl sutures. Steri-Strips and sterile dressing were applied. No apparent Complications. Patient tolerated the procedure well and she returns to the recovery room postop.

## 2015-04-06 DIAGNOSIS — M5117 Intervertebral disc disorders with radiculopathy, lumbosacral region: Secondary | ICD-10-CM | POA: Diagnosis not present

## 2015-04-06 LAB — GLUCOSE, CAPILLARY
Glucose-Capillary: 83 mg/dL (ref 70–99)
Glucose-Capillary: 94 mg/dL (ref 70–99)

## 2015-04-06 MED ORDER — HYDROCODONE-ACETAMINOPHEN 5-325 MG PO TABS: 1.0000 | ORAL_TABLET | ORAL | Status: AC | PRN

## 2015-04-06 NOTE — Discharge Instructions (Signed)
Wound Care Keep incision covered and dry for two days.   Do not put any creams, lotions, or ointments on incision. Leave steri-strips on back.  They will fall off by themselves.  Activity Walk each and every day, increasing distance each day. No lifting greater than 5 lbs.  Avoid excessive neck motion. No driving for 2 weeks; may ride as a passenger locally.  Diet Resume your normal diet.   Return to Work Will be discussed at you follow up appointment.  Call Your Doctor If Any of These Occur Redness, drainage, or swelling at the wound.  Temperature greater than 101 degrees. Severe pain not relieved by pain medication. Incision starts to come apart. Follow Up Appt Call today for appointment in 1-2 weeks ([phone]) or for problems.  If you have any hardware placed in your spine, you will need an x-ray before your appointment.  

## 2015-04-06 NOTE — Discharge Summary (Signed)
Physician Discharge Summary  Patient ID: Jessica Perkins MRN: [id number] DOB/AGE: 66-25-1952 66 y.o.  Admit date: 04/05/2015 Discharge date: 04/06/2015  Admission Diagnoses:  Discharge Diagnoses:  Principal Problem:   HNP (herniated nucleus pulposus), lumbar Active Problems:   Lumbar disc herniation with radiculopathy   Discharged Condition: good  Hospital Course: The patient was admitted to the hospital where she underwent an uncomplicated right L5-S1 redo microdiscectomy. Postoperative she is done very well. Back and lower extremity pain very much improved. She is standing and walking without difficulty. She is ready for discharge home.  Consults:   Significant Diagnostic Studies:   Treatments:   Discharge Exam: Blood pressure 150/84, pulse 70, temperature 98.5 F (36.9 C), temperature source Oral, resp. rate 18, weight 71.215 kg (157 lb), SpO2 100 %. Awake and alert. Oriented and appropriate. Motor and sensory function intact. Wound clean and dry. Chest and abdomen benign.  Disposition:      Medication List    TAKE these medications        albuterol 108 (90 BASE) MCG/ACT inhaler  Commonly known as:  PROVENTIL HFA;VENTOLIN HFA  Inhale 2 puffs into the lungs every 6 (six) hours as needed for wheezing or shortness of breath.     azelastine 0.1 % nasal spray  Commonly known as:  ASTELIN  Place 2 sprays into both nostrils 2 (two) times daily. Use in each nostril as directed     cyclobenzaprine 10 MG tablet  Commonly known as:  FLEXERIL  Take 10 mg by mouth 2 (two) times daily.     desonide 0.05 % cream  Commonly known as:  DESOWEN  Apply 1 application topically 2 (two) times daily.     doxazosin 1 MG tablet  Commonly known as:  CARDURA  Take 1 mg by mouth daily.     fexofenadine 180 MG tablet  Commonly known as:  ALLEGRA  Take 180 mg by mouth daily as needed for allergies or rhinitis.     Fluticasone-Salmeterol 250-50 MCG/DOSE Aepb  Commonly known as:  ADVAIR   Inhale into the lungs 2 (two) times daily.     HYDROcodone-acetaminophen 5-325 MG per tablet  Commonly known as:  NORCO/VICODIN  Take 1-2 tablets by mouth every 4 (four) hours as needed (mild pain).     ibuprofen 800 MG tablet  Commonly known as:  ADVIL,MOTRIN  Take 800 mg by mouth every 8 (eight) hours as needed for moderate pain.     metFORMIN 1000 MG tablet  Commonly known as:  GLUCOPHAGE  Take 1,000 mg by mouth 2 (two) times daily with a meal.     metoprolol 100 MG tablet  Commonly known as:  LOPRESSOR  Take 100 mg by mouth 2 (two) times daily.     omeprazole 20 MG capsule  Commonly known as:  PRILOSEC  Take 40 mg by mouth daily.     pravastatin 20 MG tablet  Commonly known as:  PRAVACHOL  Take 20 mg by mouth daily.     telmisartan-hydrochlorothiazide 80-25 MG per tablet  Commonly known as:  MICARDIS HCT  Take 1 tablet by mouth daily.           Follow-up Information    Follow up with Temple PaciniPOOL,Cleveland Yarbro A, MD.   Specialty:  Neurosurgery   Contact information:   [address] [place] [address] [phone]       Signed: Temple PaciniOOL,Athina Fahey A 04/06/2015, 12:11 PM

## 2015-04-06 NOTE — Progress Notes (Signed)
UR completed 

## 2015-04-06 NOTE — Progress Notes (Signed)
Patient alert and oriented, mae's well, voiding adequate amount of urine, swallowing without difficulty, no c/o pain. Patient discharged home with family. Script and discharged instructions given to patient. Patient and family stated understanding of d/c instructions given and has an appointment with MD. 

## 2015-04-09 ENCOUNTER — Encounter (HOSPITAL_COMMUNITY): Payer: Self-pay | Admitting: Neurosurgery

## 2016-03-22 IMAGING — CR DG LUMBAR SPINE 2-3V
1 series · 3 of 3 positions shown · non-contrast
Comparison: None.

CLINICAL DATA: Lumbar radiculopathy for years. Right-sided pain
radiating into the right leg.

EXAM:
LUMBAR SPINE - 2-3 VIEW

[Series 1: t lumbar spine ap · 0.14mm/px · 3 of 3 slices shown]
[im 1/3]
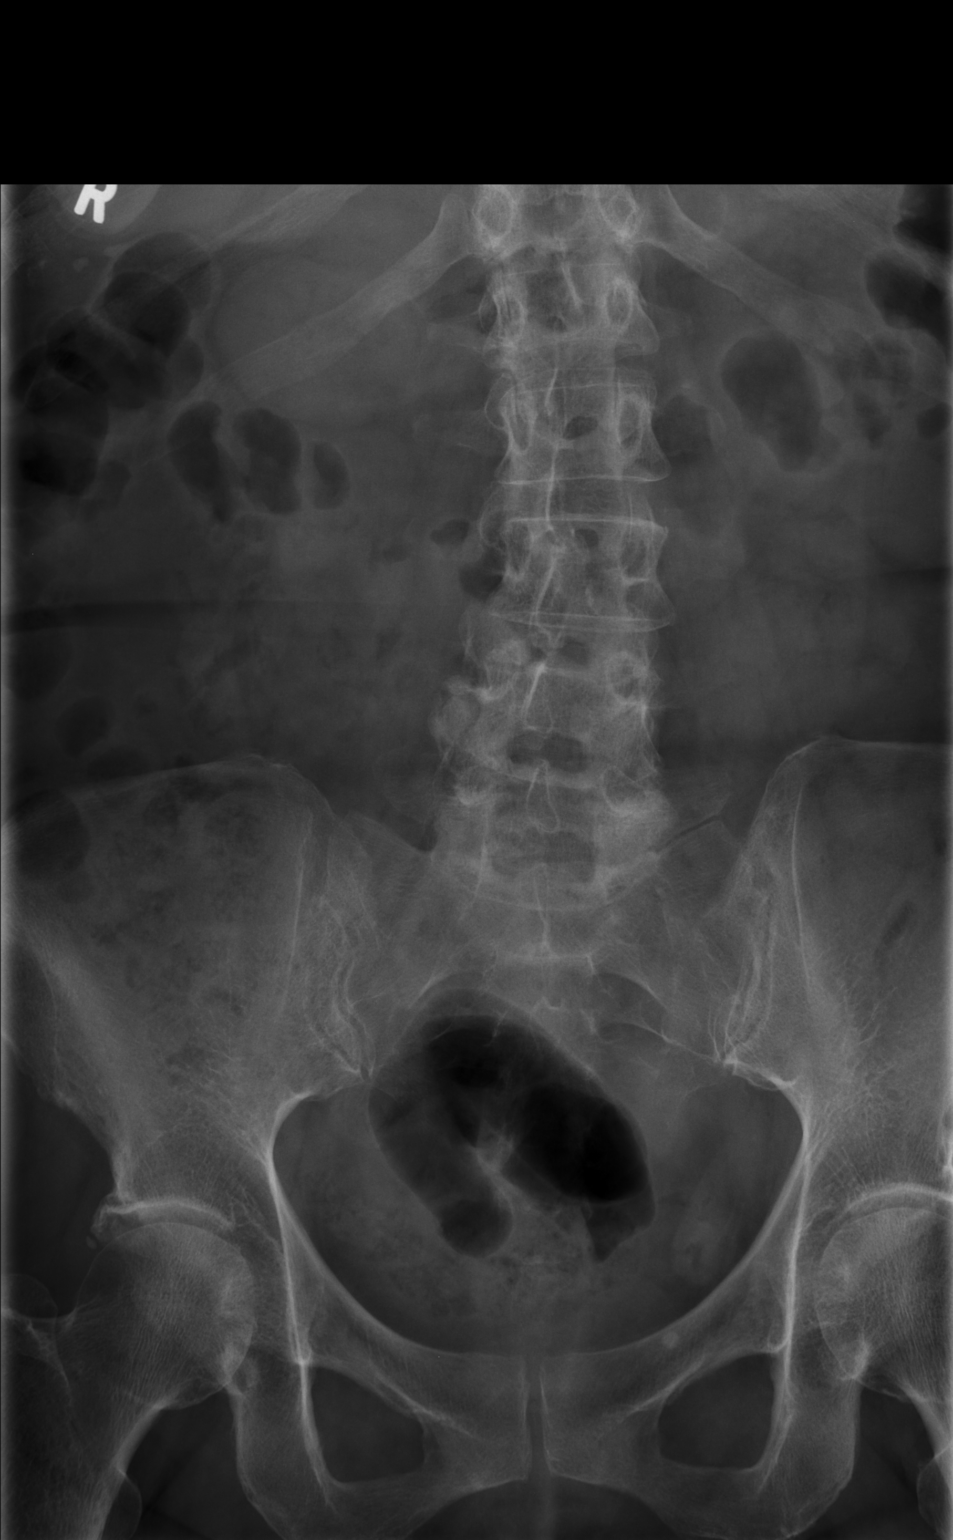
[im 2/3]
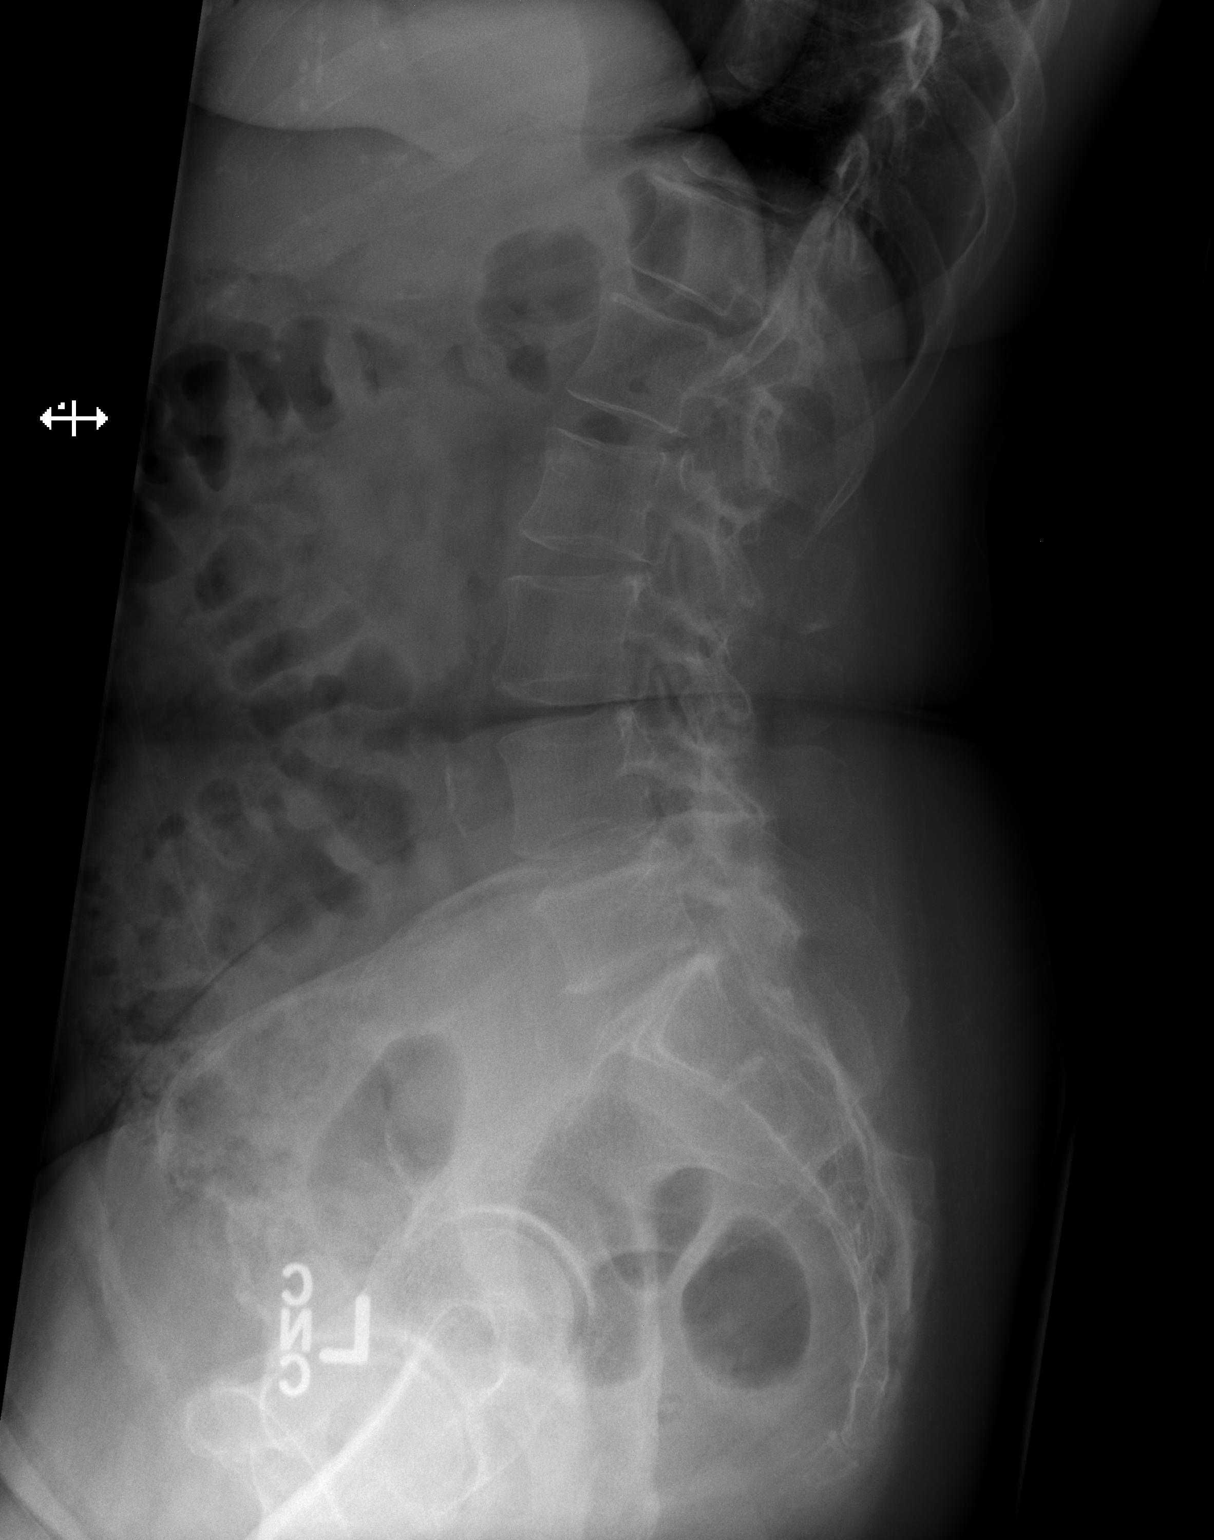
[im 3/3]
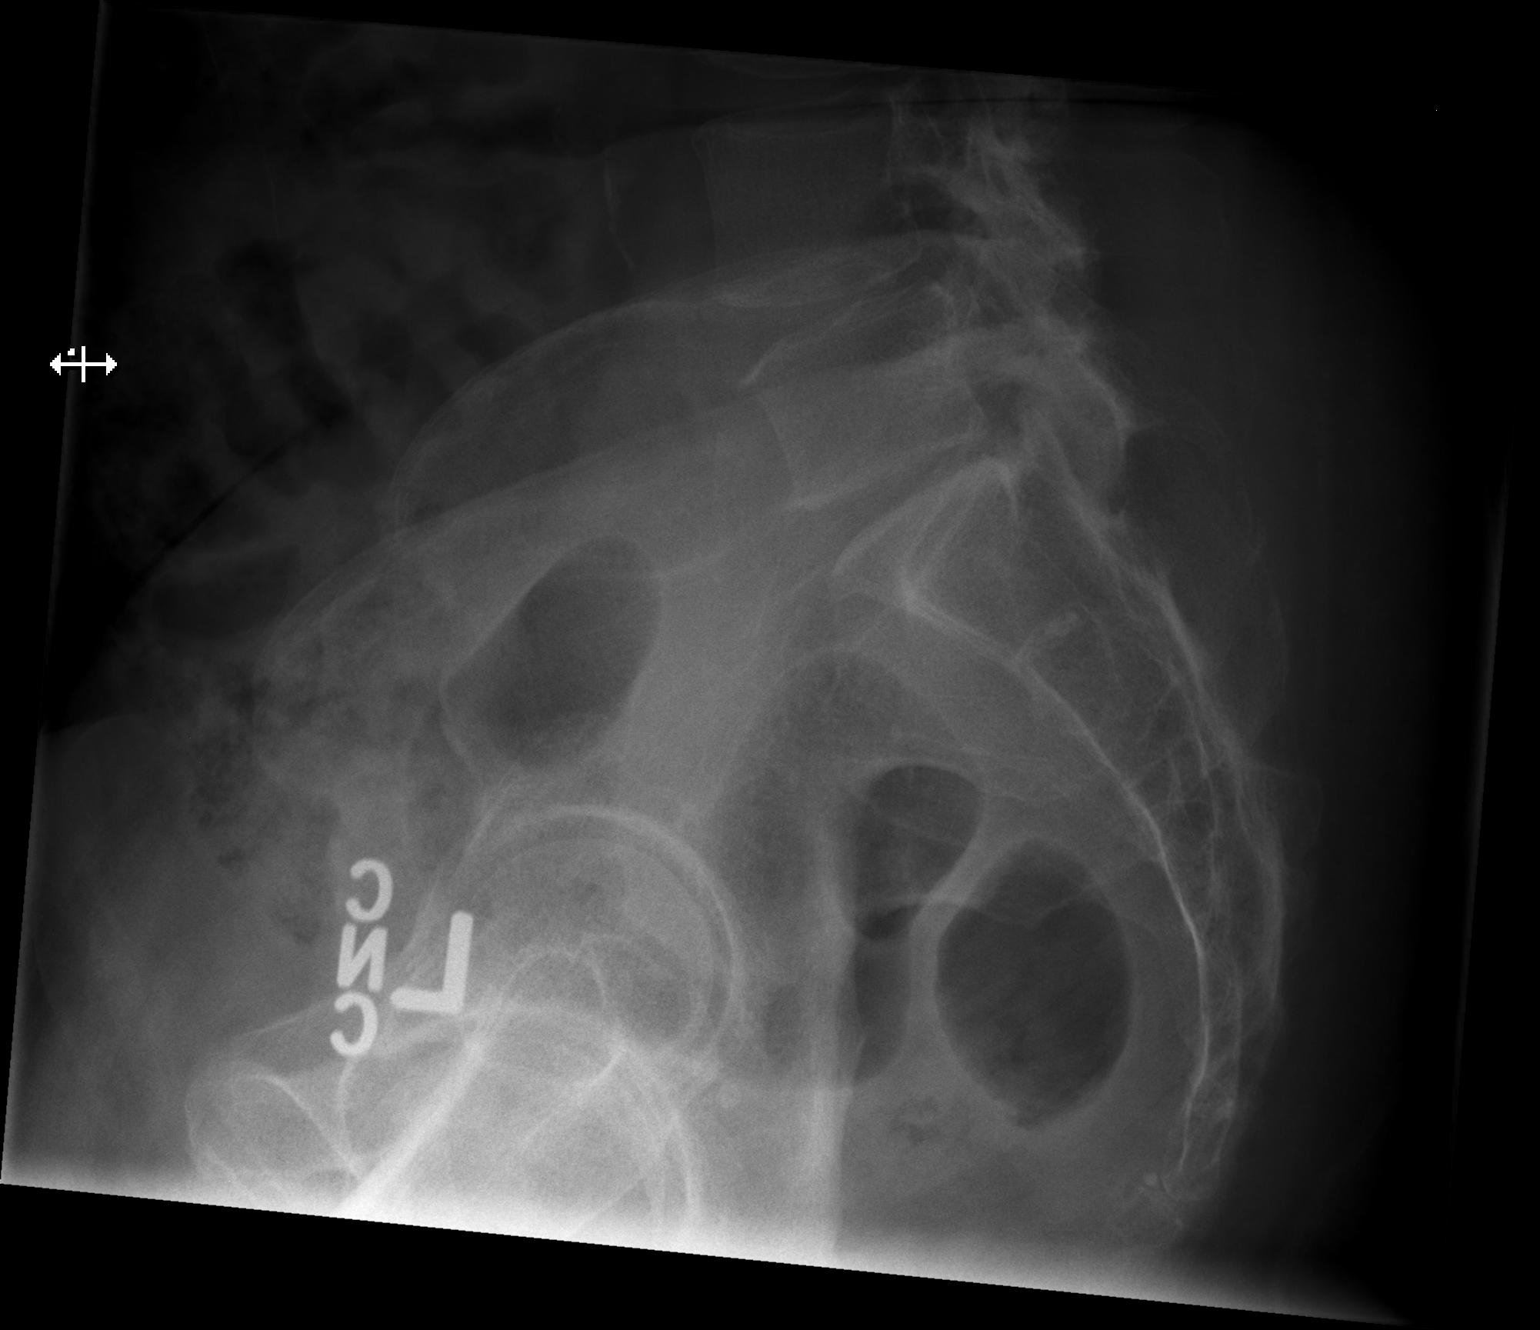

[3 of 3 positions shown; findings below may reference images not displayed]

FINDINGS: There are 5 nonrib bearing lumbar-type vertebral bodies.

The vertebral body heights are maintained.

The alignment is anatomic. There is a mild levocurvature of the
lumbar spine. The left L5 transverse process articulates with the
sacrum. There is no spondylolysis.

There is no acute fracture or static listhesis.

There is mild degenerative disc disease at L4-5 and L5-S1 with
bilateral facet arthropathy.

The SI joints are unremarkable.
IMPRESSION: 1. Mild lumbar spine spondylosis at L4-5 and L5-S1.

## 2016-04-04 IMAGING — CR DG LUMBAR SPINE 1V
1 series · 1 of 1 positions shown · non-contrast
Comparison: 03/23/2015

CLINICAL DATA: Spine surgery.

EXAM:
LUMBAR SPINE - 1 VIEW

[lateral]
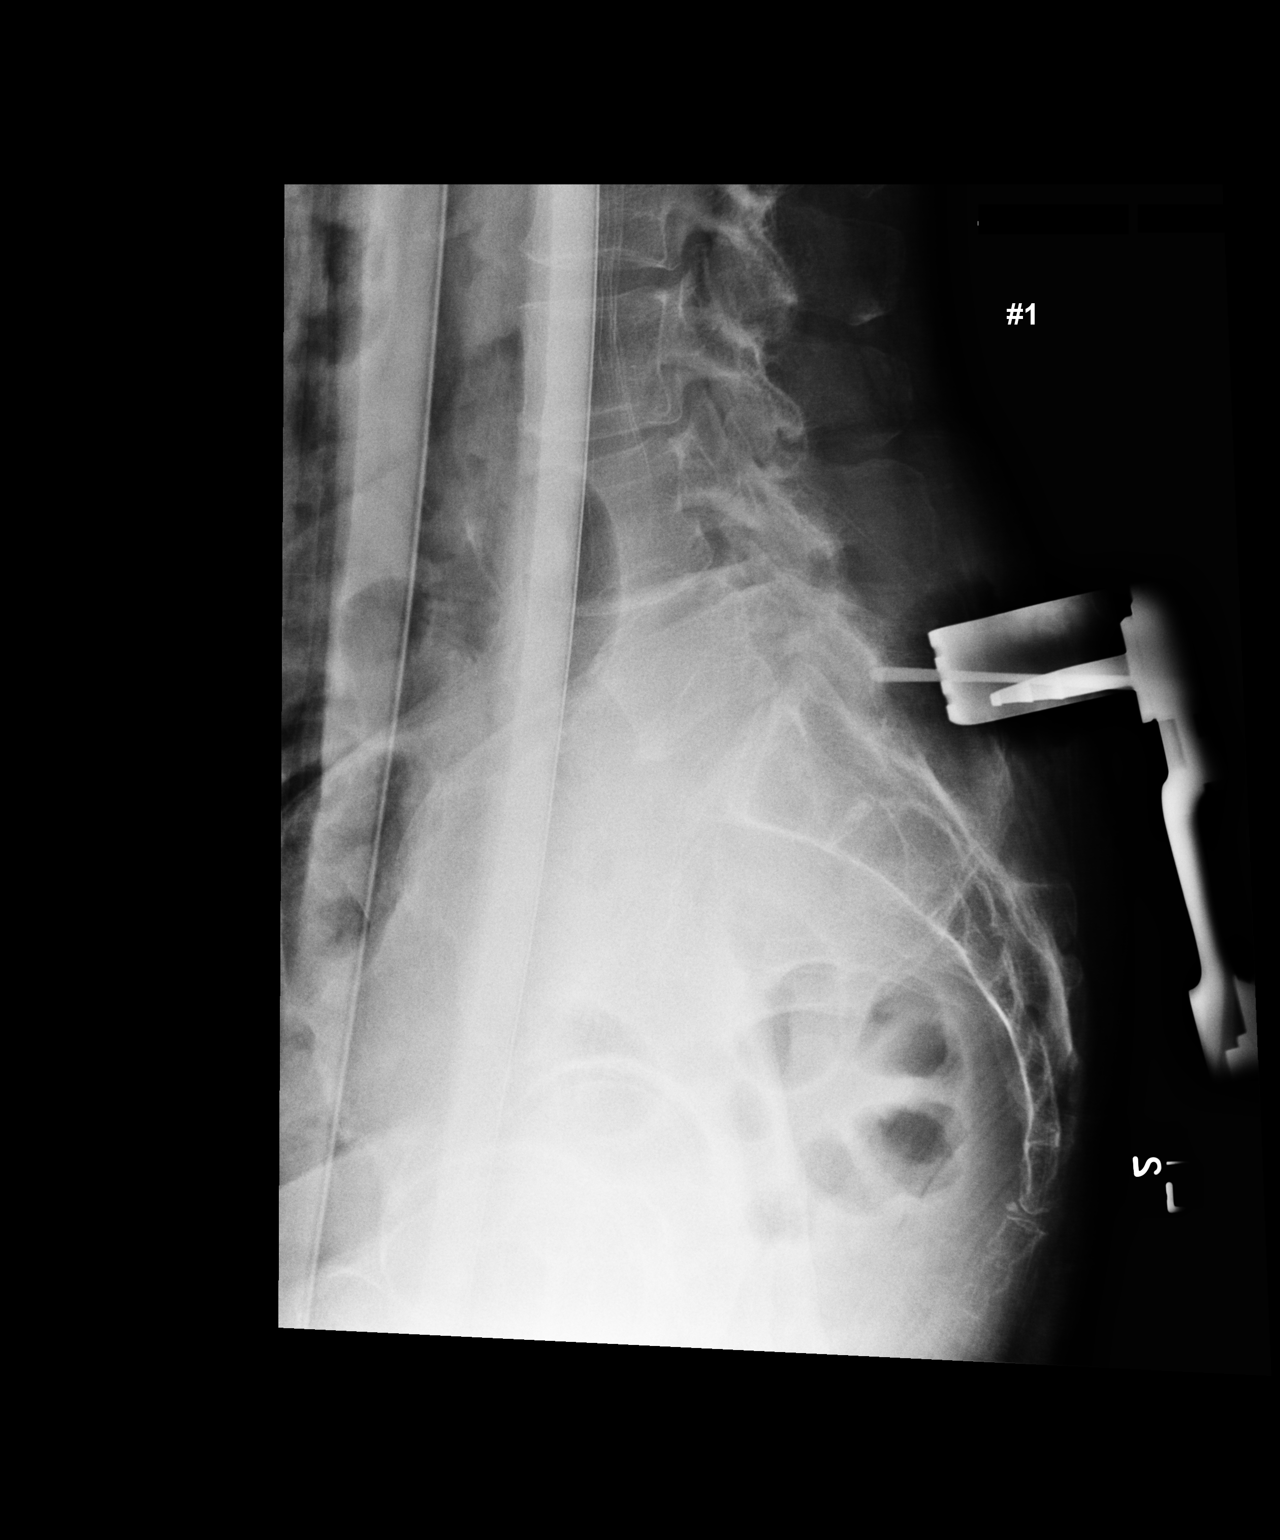

[1 of 1 positions shown; findings below may reference images not displayed]

FINDINGS: Lumbar spine numbered as per prior lumbar spine series. Metallic
marker noted posteriorly at the L5-S1 level.
IMPRESSION: Metallic marker noted posteriorly at the L5-S1 level.
